# Patient Record
Sex: Male | Born: 1985 | Race: Asian | Hispanic: No | Marital: Single | Smoking: Current every day smoker
Health system: Southern US, Community
[De-identification: ages and names within clinical notes are randomized; demographics above are authoritative.]

## PROBLEM LIST (undated history)

## (undated) DIAGNOSIS — F1120 Opioid dependence, uncomplicated: Secondary | ICD-10-CM

---

## 2005-02-21 ENCOUNTER — Emergency Department (HOSPITAL_COMMUNITY): Admission: EM | Admit: 2005-02-21 | Discharge: 2005-02-21 | Payer: Self-pay | Admitting: Emergency Medicine

## 2005-03-23 ENCOUNTER — Emergency Department (HOSPITAL_COMMUNITY): Admission: EM | Admit: 2005-03-23 | Discharge: 2005-03-23 | Payer: Self-pay | Admitting: Emergency Medicine
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
## 2008-11-19 ENCOUNTER — Emergency Department (HOSPITAL_BASED_OUTPATIENT_CLINIC_OR_DEPARTMENT_OTHER): Admission: EM | Admit: 2008-11-19 | Discharge: 2008-11-19 | Payer: Self-pay | Admitting: Emergency Medicine

## 2011-01-11 LAB — CBC
HCT: 44.9 % (ref 39.0–52.0)
MCHC: 33.6 g/dL (ref 30.0–36.0)
MCV: 87.8 fL (ref 78.0–100.0)
Platelets: 253 10*3/uL (ref 150–400)
RDW: 12.9 % (ref 11.5–15.5)

## 2011-01-11 LAB — URINALYSIS, ROUTINE W REFLEX MICROSCOPIC
Ketones, ur: >80 mg/dL — AB
Specific Gravity, Urine: 1.019 (ref 1.005–1.030)
pH: 7 (ref 5.0–8.0)

## 2011-01-11 LAB — DIFFERENTIAL
Basophils Absolute: 0.2 10*3/uL — ABNORMAL HIGH (ref 0.0–0.1)
Basophils Relative: 2 % — ABNORMAL HIGH (ref 0–1)
Eosinophils Relative: 1 % (ref 0–5)
Lymphocytes Relative: 8 % — ABNORMAL LOW (ref 12–46)
Lymphs Abs: 0.8 10*3/uL (ref 0.7–4.0)

## 2011-01-11 LAB — COMPREHENSIVE METABOLIC PANEL
ALT: 21 U/L (ref 0–53)
Albumin: 5.1 g/dL (ref 3.5–5.2)
Calcium: 9.7 mg/dL (ref 8.4–10.5)
Creatinine, Ser: 0.7 mg/dL (ref 0.4–1.5)
GFR calc Af Amer: >60 mL/min (ref >60)
Total Protein: 9.4 g/dL — ABNORMAL HIGH (ref 6.0–8.3)

## 2011-01-11 LAB — POCT TOXICOLOGY PANEL: Opiates: POSITIVE

## 2011-01-11 LAB — URINE MICROSCOPIC-ADD ON

## 2014-10-16 ENCOUNTER — Emergency Department (HOSPITAL_COMMUNITY)
Admission: EM | Admit: 2014-10-16 | Discharge: 2014-10-16 | Disposition: A | Discharge status: Home / Self Care | Payer: Self-pay | Attending: Emergency Medicine | Admitting: Emergency Medicine

## 2014-10-16 ENCOUNTER — Encounter (HOSPITAL_COMMUNITY): Payer: Self-pay | Admitting: Emergency Medicine

## 2014-10-16 DIAGNOSIS — Z720 Tobacco use: Secondary | ICD-10-CM | POA: Insufficient documentation

## 2014-10-16 DIAGNOSIS — F1110 Opioid abuse, uncomplicated: Secondary | ICD-10-CM | POA: Insufficient documentation

## 2014-10-16 HISTORY — DX: Opioid dependence, uncomplicated: F11.20

## 2014-10-16 MED ORDER — DICYCLOMINE HCL 10 MG PO CAPS
10.0000 mg | ORAL_CAPSULE | Freq: Once | ORAL | Status: AC
  Administered 2014-10-16: 10 mg via ORAL
  Filled 2014-10-16: qty 1

## 2014-10-16 MED ORDER — DICYCLOMINE HCL 20 MG PO TABS: 20.0000 mg | ORAL_TABLET | Freq: Two times a day (BID) | ORAL | Status: AC

## 2014-10-16 MED ORDER — ONDANSETRON 4 MG PO TBDP
4.0000 mg | ORAL_TABLET | Freq: Once | ORAL | Status: AC
  Administered 2014-10-16: 4 mg via ORAL
  Filled 2014-10-16: qty 1

## 2014-10-16 MED ORDER — ONDANSETRON 4 MG PO TBDP: ORAL_TABLET | ORAL | Status: AC

## 2014-10-16 NOTE — Discharge Instructions (Signed)
Opioid Withdrawal Opioids are a group of narcotic drugs. They include the street drug heroin. They also include pain medicines, such as morphine, hydrocodone, oxycodone, and fentanyl. Opioid withdrawal is a group of characteristic physical and mental signs and symptoms. It typically occurs if you have been using opioids daily for several weeks or longer and stop using or rapidly decrease use. Opioid withdrawal can also occur if you have used opioids daily for a long time and are given a medicine to block the effect.  SIGNS AND SYMPTOMS Opioid withdrawal includes three or more of the following symptoms:   Depressed, anxious, or irritable mood.  Nausea or vomiting.  Muscle aches or spasms.   Watery eyes.   Runny nose.  Dilated pupils, sweating, or hairs standing on end.  Diarrhea or intestinal cramping.  Yawning.   Fever.  Increased blood pressure.  Fast pulse.  Restlessness or trouble sleeping. These signs and symptoms occur within several hours of stopping or reducing short-acting opioids, such as heroin. They can occur within 3 days of stopping or reducing long-acting opioids, such as methadone. Withdrawal begins within minutes of receiving a drug that blocks the effects of opioids, such as naltrexone or naloxone. DIAGNOSIS  Opioid use disorder is diagnosed by your health care provider. You will be asked about your symptoms, drug and alcohol use, medical history, and use of medicines. A physical exam may be done. Lab tests may be ordered. Your health care provider may have you see a mental health professional.  TREATMENT  The treatment for opioid withdrawal is usually provided by medical doctors with special training in substance use disorders (addiction specialists). The following medicines may be included in treatment:  Opioids given in place of the abused opioid. They turn on opioid receptors in the brain and lessen or prevent withdrawal symptoms. They are gradually  decreased (opioid substitution and taper).  Non-opioids that can lessen certain opioid withdrawal symptoms. They may be used alone or with opioid substitution and taper. Successful long-term recovery usually requires medicine, counseling, and group support. HOME CARE INSTRUCTIONS   Take medicines only as directed by your health care provider.  Check with your health care provider before starting new medicines.  Keep all follow-up visits as directed by your health care provider. SEEK MEDICAL CARE IF:  You are not able to take your medicines as directed.  Your symptoms get worse.  You relapse. SEEK IMMEDIATE MEDICAL CARE IF:  You have serious thoughts about hurting yourself or others.  You have a seizure.  You lose consciousness. Document Released: 09/15/2003 Document Revised: 01/27/2014 Document Reviewed: 09/25/2013 [facility] Patient Information 2015 [place], [place]. This information is not intended to replace advice given to you by your health care provider. Make sure you discuss any questions you have with your health care provider.    Emergency Department Resource Guide 1) Find a Doctor and Pay Out of Pocket Although you won't have to find out who is covered by your insurance plan, it is a good idea to ask around and get recommendations. You will then need to call the office and see if the doctor you have chosen will accept you as a new patient and what types of options they offer for patients who are self-pay. Some doctors offer discounts or will set up payment plans for their patients who do not have insurance, but you will need to ask so you aren't surprised when you get to your appointment.  2) Contact Your Local Health Department Not all health  departments have doctors that can see patients for sick visits, but many do, so it is worth a call to see if yours does. If you don't know where your local health department is, you can check in your phone book. The CDC also has a tool  to help you locate your state's health department, and many state websites also have listings of all of their local health departments.  3) Find a Walk-in Clinic If your illness is not likely to be very severe or complicated, you may want to try a walk in clinic. These are popping up all over the country in pharmacies, drugstores, and shopping centers. They're usually staffed by nurse practitioners or physician assistants that have been trained to treat common illnesses and complaints. They're usually fairly quick and inexpensive. However, if you have serious medical issues or chronic medical problems, these are probably not your best option.  No Primary Care Doctor: - Call Health Connect at  [phone] - they can help you locate a primary care doctor that  accepts your insurance, provides certain services, etc. - Physician Referral Service- [phone]  Chronic Pain Problems: Organization         Address  Phone   Notes  [facility]  [phone] Patients need to be referred by their primary care doctor.   Medication Assistance: Organization         Address  Phone   Notes  [facility] [address] [place], [place] [address] [phone] --Must be a resident of [facility] [place] -- Must have NO insurance coverage whatsoever (no Medicaid/ Medicare, etc.) -- The pt. MUST have a primary care doctor that directs their care regularly and follows them in the community   MedAssist  [phone]   [employer]  [phone]    Agencies that provide inexpensive medical care: Organization         Address  Phone   Notes  [facility]  [phone]) [phone]   [facility]    [phone]   [facility] [address] [place], [place] [address] [phone]) [phone]   [facility] [address], [place] [phone]   Planned Parenthood    [phone]    [facility]    [phone]   [facility]  [address], [place] Phone:  [phone], Fax:  [phone] Hours of Operation:  9 am - 6 pm, M-F.  Also accepts Medicaid/Medicare and self-pay.  [facility]  [address], [place] Phone: [phone]) [phone], Fax: [phone]) [phone]. Hours of Operation:  8:30 am - 5:30 pm, M-F.  Also accepts Medicaid and self-pay.  [facility] [place] [address], [place] Phone: [phone](336) [phone]   [facility] [address], [place], [place] [phone], Ext. [phone] Mondays & Thursdays: 7-9 AM.  First 15 patients are seen on a first come, first serve basis.    Medicaid-accepting [facility] Providers:  Organization         Address  Phone   Notes  [facility] [address], [place] [phone]) [phone] Also accepts self-pay patients.  [facility] [address] [address], [place]  [phone]) [phone]   [facility] [address], [place] [phone]   [facility] [address] [place] [phone]   Renaye Rakers [address], [place]   [phone] Only accepts [place] Access [place] patients after they have their name applied to their card.   Self-Pay (no insurance) in [facility]:  Organization         Address  Phone   Notes  Sickle Cell Patients, [facility] [address] [place], [place] [phone]   [facility] [address] [place], [place] [phone]   [facility] [place]  [address], [place] [phone]   [facility]/Dr. Osei-Bonsu  [address], [place] or [address], [place] [phone] Phone number for both [place] and [place] locations is the same.  [facility] [address] [place] [phone]   [facility] [address] [place] [address] [place] or [address] [phone]   [facility] [address] [place] [phone], phone; [phone], fax Sees patients 1st and 3rd Saturday of every month.  Must not qualify for public or private insurance (i.e. Medicaid, Medicare, [place] Health Choice, Veterans' Benefits)  Household income should be no more than 200% of the poverty level The clinic cannot treat you if you are pregnant or think you are pregnant  Sexually transmitted diseases are not treated at the clinic.    Dental Care: Organization         Address  Phone  Notes  [facility] Department of [facility] [address] [place], [place] [phone] Accepts children up to age 67 who are enrolled in [place] or [place] Health Choice; pregnant women with a Medicaid card; and children who have applied for Medicaid or [place] Health Choice, but were declined, whose parents can pay a reduced fee at time of service.  [facility] of [facility]  [address], [place] [phone] Accepts children up to age 36 who are enrolled in [place] or [place] Health Choice; pregnant women with a Medicaid card; and children who have applied for Medicaid or [place] Health Choice, but were declined, whose parents can pay a reduced fee at time of service.  [facility]  [address] [place], [place] [phone] Patients are seen by appointment only. Walk-ins are not accepted. [facility] will see patients 108 years of age and older. Monday - Tuesday (8am-5pm) Most Wednesdays (8:30-5pm) $30 per visit, cash only  [facility] Access [facility]  [address], [facility] [phone] Patients are seen by appointment only. Walk-ins are not accepted. [facility] will see patients 27 years of age and older. One Wednesday  Evening (Monthly: Volunteer Based).  $30 per visit, cash only  [employer]  [phone] for adults; Children under age 45, call Graduate Pediatric Dentistry at [phone]. Children aged 41-14, please call [phone] to request a pediatric application.  Dental services are provided in all areas of dental care including fillings, crowns and bridges, complete and partial dentures, implants, gum treatment, root canals, and extractions. Preventive care is also provided. Treatment is provided to both adults and children. Patients are selected via a lottery and there is often a waiting list.   [facility] [address] [place]  [phone] [email or web address]   [facility] [address] [place], [place] [phone], Ext. [phone] Second and Fourth Thursday of each month, opens at 6:30 AM; Clinic ends at 9 AM.  Patients are seen on a first-come first-served basis, and a limited number are seen during each clinic.   [facility]  [address] [place], [place] [phone]   Eligibility Requirements You must have lived in [place], [place], or [place] counties for at least the last three months.   You cannot be eligible for state or federal sponsored [place], including [employer], [place], or [employer].   You generally cannot be eligible for healthcare insurance through your employer.    How to apply: Eligibility screenings are held every Tuesday and Wednesday afternoon from 1:00 pm until 4:00 pm. You do not need an appointment for the interview!  [facility] [address] [place], [place] [phone]   [facility] Health Department  [phone]   [facility] Health Department  [phone]   [facility] Health Department  [phone]    Behavioral Health Resources in the Community: Intensive Outpatient Programs Organization         Address  Phone  Notes  [facility] [address] [place], [place] [phone]   [facility] Outpatient [address], [place], [place] [phone]   [facility] [address], [place], [place]  [phone]   [facility] Mental Health [address]  [place], [place] [phone] or [phone]   Substance Abuse Resources Organization         Address  Phone  Notes  [facility] Drug Services  [phone]   [facility]  [phone]   [facility]  [phone]   Floydene Flock  [phone]   Residential & Outpatient Substance Abuse Program  [phone]   Psychological Services Organization         Address  Phone  Notes  [facility]  [phone]   [facility] Services  [phone]   [facility] Mental Health [address], [place] [phone] or [phone]    Mobile Crisis Teams Organization         Address  Phone  Notes  [facility], Mobile Crisis Care Unit  [phone]   [facility]  [address] [place], [place] [phone]   Doristine Locks [address] [place] [phone]    Self-Help/Support Groups Organization         Address  Phone             Notes  [facility] [place] - variety of support groups  [phone]- I7437963 Call for more information  Narcotics Anonymous (NA), [facility] [address], [employer] [place]  2 meetings at this location   Statistician         Address  Phone  Notes  [facility] [address],    [place]  [phone]   [facility]  [address] [place] [address], [place], [place] [phone]   [facility] [address] [place], [place] [phone] Admissions: 8am-3pm M-F  [facility] [address]    [place], [place] [phone]   [facility] [address]  [place], [place] [phone]   [facility] [address]  [place], [place] [phone]   [facility] - Intensive Outpatient [phone], [place], [place] [phone]   [facility] (Addiction Recovery Care [facility]oc[facility] [address]  [place], [place] [phone] or [phone]   [facility] ([facility]) [address] [place], [place] [phone] Accepts Medicaid  [employer] [address],  [place] [phone] Substance Abuse/Addiction Treatment   [facility] Organization         Address  Phone  Notes  [facility]  [phone]   Angie Fava, PhD [address] Kn[address] [place], [place]   [phone] or [phone]   [facility] Behavioral   [address] [place], [place] [phone]   [facility] [address] [place], [place] [phone] Insurance/Medicaid/sponsorship through [facility] and Families [address]                                    [place], [place] [phone] Therapy/tele-psych/case  [facility] [address], [place] [phone]    Dr. Lolly Mustache  [phone]   [facility] Safer  [employer] [facility] Dept. 1) [address] [place] 2) [address], [place] 3)  [address], [place] [phone]  [phone]   [facility] Child Abuse Hotline [phone] or 3396599687 (After Hours)      It is important for you to follow-up with primary care for further evaluation and management of your symptoms. Please take your medications as prescribed to help with any symptoms associated with your opioid withdrawal. Return to ED for worsening symptoms.

## 2014-10-16 NOTE — ED Notes (Addendum)
[facility] he had to detox off of opiates before he could be seen. States his last dose was about 6 hours ago. Pt denies n/v/tremors, hallucinations. Denies any alcohol or other substance abuse, only cigarettes. NAD

## 2014-10-16 NOTE — ED Provider Notes (Signed)
CSN: [id number]     Arrival date & time 10/16/14  1920 History   First MD Initiated Contact with Patient 10/16/14 2010     Chief Complaint  Patient presents with  . Addiction Problem     (Consider location/radiation/quality/duration/timing/severity/associated sxs/prior Treatment) HPI Jackquline Berlinaul S Conde is a 29 y.o. male who comes in for evaluation of opiate detox. Patient states that he is ready to detox from opiates. Reports [facility] and was told he needed to come to the hospital to detox Last took oxycodone 6 hours ago. He denies any symptoms at this time. No headaches, vision changes, chest pain shortness of breath, nausea or vomiting, abdominal discomfort, numbness or weakness. Denies any other substance use. No suicidal or homicidal ideations. No delusions or hallucinations.  Past Medical History  Diagnosis Date  . Opiate addiction    History reviewed. No pertinent past surgical history. No family history on file. History  Substance Use Topics  . Smoking status: Current Every Day Smoker  . Smokeless tobacco: Not on file  . Alcohol Use: No    Review of Systems  All other systems reviewed and are negative.  A 10 point review of systems was completed and was negative except for pertinent positives and negatives as mentioned in the history of present illness     Allergies  Review of patient's allergies indicates no known allergies.  Home Medications   Prior to Admission medications   Medication Sig Start Date End Date Taking? Authorizing Provider  dicyclomine (BENTYL) 20 MG tablet Take 1 tablet (20 mg total) by mouth 2 (two) times daily. 10/16/14   Sharlene MottsBenjamin W Ismerai Bin, PA-C  ondansetron (ZOFRAN ODT) 4 MG disintegrating tablet 4mg  ODT q4 hours prn nausea/vomit 10/16/14   Earle GellBenjamin W Sameena Artus, PA-C   BP 121/73 mmHg  Pulse 105  Temp(Src) 97.7 F (36.5 C) (Oral)  Resp 17  Ht 6' (1.829 m)  Wt 240 lb (108.863 kg)  BMI 32.54 kg/m2  SpO2 97% Physical Exam   Constitutional: He is oriented to person, place, and time. He appears well-developed and well-nourished.  HENT:  Head: Normocephalic and atraumatic.  Mouth/Throat: Oropharynx is clear and moist.  Eyes: Conjunctivae are normal. Pupils are equal, round, and reactive to light. Right eye exhibits no discharge. Left eye exhibits no discharge. No scleral icterus.  Neck: Normal range of motion. Neck supple.  Cardiovascular: Normal rate, regular rhythm and normal heart sounds.   Pulmonary/Chest: Effort normal and breath sounds normal. No respiratory distress. He has no wheezes. He has no rales.  Abdominal: Soft. He exhibits no distension. There is no tenderness.  Musculoskeletal: He exhibits no tenderness.  Neurological: He is alert and oriented to person, place, and time.  Cranial Nerves II-XII grossly intact  Skin: Skin is warm and dry. No rash noted.  Multiple areas of raised induration around injection sites in [facility] and forearm. No erythema, drainage. No evidence of cellulitis.  Psychiatric: He has a normal mood and affect.  Nursing note and vitals reviewed.   ED Course  Procedures (including critical care time) Labs Review Labs Reviewed - No data to display  Imaging Review No results found.   EKG Interpretation None     Meds given in ED:  Medications  dicyclomine (BENTYL) capsule 10 mg (10 mg Oral Given 10/16/14 2056)  ondansetron (ZOFRAN-ODT) disintegrating tablet 4 mg (4 mg Oral Given 10/16/14 2056)    Discharge Medication List as of 10/16/2014  8:49 PM    START  taking these medications   Details  dicyclomine (BENTYL) 20 MG tablet Take 1 tablet (20 mg total) by mouth 2 (two) times daily., Starting 10/16/2014, Until Discontinued, Print    ondansetron (ZOFRAN ODT) 4 MG disintegrating tablet  ODT q4 hours prn nausea/vomit, Print       Filed Vitals:   10/16/14 1935 10/16/14 2025 10/16/14 2029 10/16/14 2054  BP: 130/85 129/75  121/73  Pulse: 102 87  105  Temp: 97.7 F  (36.5 C)     TempSrc: Oral     Resp: Height: 6' (1.829 m)     Weight: 240 lb (108.863 kg)     SpO2: 98% 96% 98% 97%    MDM  Vitals stable - afebrile, mild tachycardia not concerning Pt resting comfortably in ED. Denies SI/HI, Benzo or EtOH use. PE--not concerning further acute or emergent pathology  DDX--patient given prophylactic treatment for symptoms in the ED. Discharged with Bentyl and Zofran as well as resource guide to follow-up with outpatient detox program.  I discussed all relevant lab findings and imaging results with pt and they verbalized understanding. Discussed f/u with PCP within 48 hrs and return precautions, pt very amenable to plan. Patient stable, in good condition and is appropriate for discharge   Final diagnoses:  Opiate abuse, continuous      Sharlene Motts, PA-C 10/16/14 2358  Vanetta Mulders, MD 10/17/14 [phone]

## 2014-10-16 NOTE — ED Notes (Signed)
Pt. requesting detox from Opiate ( Oxycodone ) addiction , deneis suicidal ideation , no hallucinations or delusions , last Oxycodone intake today , pt. also reported swelling at IV drug use injection sites at right arm .
